# Patient Record
Sex: Male | Born: 1965 | Race: White | Hispanic: No | Smoking: Former smoker
Health system: Southern US, Community
[De-identification: ages and names within clinical notes are randomized; demographics above are authoritative.]

## PROBLEM LIST (undated history)

## (undated) DIAGNOSIS — T7840XA Allergy, unspecified, initial encounter: Secondary | ICD-10-CM

## (undated) DIAGNOSIS — R3915 Urgency of urination: Secondary | ICD-10-CM

## (undated) DIAGNOSIS — G479 Sleep disorder, unspecified: Secondary | ICD-10-CM

## (undated) DIAGNOSIS — R319 Hematuria, unspecified: Secondary | ICD-10-CM

## (undated) DIAGNOSIS — N400 Enlarged prostate without lower urinary tract symptoms: Secondary | ICD-10-CM

## (undated) HISTORY — PX: DENTAL SURGERY: SHX609

## (undated) HISTORY — PX: FRACTURE SURGERY: SHX138

## (undated) HISTORY — PX: VASECTOMY: SHX75

## (undated) HISTORY — DX: Hematuria, unspecified: R31.9

## (undated) HISTORY — PX: HERNIA REPAIR: SHX51

## (undated) HISTORY — PX: APPENDECTOMY: SHX54

## (undated) HISTORY — DX: Urgency of urination: R39.15

## (undated) HISTORY — DX: Allergy, unspecified, initial encounter: T78.40XA

## (undated) HISTORY — DX: Benign prostatic hyperplasia without lower urinary tract symptoms: N40.0

## (undated) HISTORY — DX: Sleep disorder, unspecified: G47.9

---

## 2006-06-20 ENCOUNTER — Emergency Department (HOSPITAL_COMMUNITY): Admission: EM | Admit: 2006-06-20 | Discharge: 2006-06-20 | Payer: Self-pay | Admitting: Emergency Medicine

## 2006-07-25 ENCOUNTER — Ambulatory Visit (HOSPITAL_COMMUNITY): Admission: RE | Admit: 2006-07-25 | Discharge: 2006-07-25 | Payer: Self-pay | Admitting: General Surgery

## 2007-01-06 ENCOUNTER — Emergency Department (HOSPITAL_COMMUNITY): Admission: EM | Admit: 2007-01-06 | Discharge: 2007-01-06 | Payer: Self-pay | Admitting: Emergency Medicine

## 2007-09-26 ENCOUNTER — Emergency Department (HOSPITAL_COMMUNITY): Admission: EM | Admit: 2007-09-26 | Discharge: 2007-09-26 | Payer: Self-pay | Admitting: Emergency Medicine

## 2008-07-03 HISTORY — PX: SHOULDER SURGERY: SHX246

## 2008-11-06 ENCOUNTER — Observation Stay (HOSPITAL_COMMUNITY): Admission: AC | Admit: 2008-11-06 | Discharge: 2008-11-07 | Payer: Self-pay

## 2010-10-11 LAB — POCT I-STAT, CHEM 8
Glucose, Bld: 133 mg/dL — ABNORMAL HIGH (ref 70–99)
HCT: 48 % (ref 39.0–52.0)
Hemoglobin: 16.3 g/dL (ref 13.0–17.0)
Potassium: 3.9 meq/L (ref 3.5–5.1)
Sodium: 140 meq/L (ref 135–145)
TCO2: 22 mmol/L (ref 0–100)

## 2010-10-11 LAB — CBC
Platelets: 270 10*3/uL (ref 150–400)
RDW: 13.1 % (ref 11.5–15.5)
WBC: 10 10*3/uL (ref 4.0–10.5)

## 2010-10-11 LAB — ABO/RH: ABO/RH(D): A POS

## 2010-10-11 LAB — TYPE AND SCREEN
ABO/RH(D): A POS
Antibody Screen: NEGATIVE

## 2010-10-11 LAB — PROTIME-INR: Prothrombin Time: 13.8 seconds (ref 11.6–15.2)

## 2010-10-11 LAB — POCT CARDIAC MARKERS: CKMB, poc: <1 ng/mL — ABNORMAL LOW (ref 1.0–8.0)
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
## 2010-11-18 NOTE — Op Note (Signed)
NAMEJORRELL, KUSTER               ACCOUNT NO.:  [id number]   MEDICAL RECORD NO.:  [id number]          PATIENT TYPE:  AMB   LOCATION:  DAY                          FACILITY:  [facility]   PHYSICIAN:  Anselm Pancoast. Weatherly, M.D.DATE OF BIRTH:  [birth year]   DATE OF PROCEDURE:  07/25/2006  DATE OF DISCHARGE:                               OPERATIVE REPORT   PREOPERATIVE DIAGNOSES:  Right inguinal hernia.   POSTOPERATIVE DIAGNOSES:  Not given.   OPERATION:  Right inguinal herniorrhaphy with mesh reinforcement.   ANESTHESIA:  General.   HISTORY:  Josejuan Hoaglin is 45 year old male who has a symptomatic right  inguinal hernia. He said he first noticed this probably a year ago. He  would admittedly get some testicular swelling and pain in the right  groin when he was doing strenuous activity. He is a singer in a band and  with his sustained breathing in the high note, he noticed that this  would give him discomfort. He had been to the [facility] emergency room 2  weeks before Christmas and the PA diagnosed a right inguinal hernia and  referred him to [place] and I saw him the first week of January.  On examination, he is tall, weighs about 175 pounds. He is a lanky  individual. I did not appreciate any organomegaly and with straining he  has got an easily noticed right inguinal hernia. It looks like more of  an indirect hernia to me. I could appreciate no abnormality on the  testicular exam, I do not appreciate a definite hydrocele and I do not  appreciate any abnormality on the left. He is scheduled for the  herniorrhaphy and he elected for general anesthesia.   The patient was taken to the operative suite and after the patient  identified, we marked the right side. He was induced with anesthesia,  endotracheal tube. The right groin area was clipped and then prepped  with Betadine solution. The inguinal incision area was infiltrated with  0.5% Marcaine with adrenaline, the ilioinguinal nerve area was  anesthetized with about 10 mL of this solution with a blunted 22 gauge  needle. Sharp dissection into the skin and subcutaneous tissue, there  were 2 superficial veins that were clamped and ligated with 3-0 Vicryl.  Then the external oblique aponeurosis opened up through the external  ring. The cord structures were elevated. There was an indirect hernia  sac about 3 inches in length that was separated from the cord structures  and a high sack ligation done with 2-0 Surgilon under direct vision. He  does have kind of a weakness in the floor, not really a definite large  direct but I elected to repair this with kind of a modified __________  starting at the symphysis pubis, suturing the conjoined tendon to the  shelving edge of the inguinal ligament going back typing the 2 ends  together and the new internal ring area with it at the tip of my  fingertip. I then used a piece of mesh and made a __________  slit  laterally to reinforce the floor. It was sutured to the  inguinal  ligament with a running 2-0 Prolene. The two tails sutured together  laterally and then the superior flap sutured to the rectus over the  internal oblique with interrupted sutures of 2-0 Vicryl. The mesh is  lying flat, not excessively taut. I did use about 5 mL of the Marcaine  in the inguinal ligament area and then the external oblique was closed  with a running 3-0 Vicryl. The ilioinguinal nerve is with the cord  structures and the Scarpa's fascia was closed with interrupted 3-0  Vicryl and then 4-0 Dexon used subcuticular. Benzoin and Steri-Strips on  the skin. The testicle was in its normal position and he was extubated  and sent to the  recovery room in a stable postop condition. The patient will be released  after a short stay and instructed not to drive if he has taken any  Vicodin. Hopefully he will be back to light activity next week and about  3 weeks before doing strenuous activity.            ______________________________  Anselm Pancoast. Zachery Dakins, M.D.     WJW/MEDQ  D:  07/25/2006  T:  07/25/2006  Job:  [id number]

## 2011-03-27 LAB — DIFFERENTIAL
Lymphocytes Relative: 16
Lymphs Abs: 1.4
Monocytes Relative: 11
Neutro Abs: 6.4
Neutrophils Relative %: 73

## 2011-03-27 LAB — COMPREHENSIVE METABOLIC PANEL
ALT: 18
BUN: 22
CO2: 25
Calcium: 9.7
Creatinine, Ser: 1.29
GFR calc non Af Amer: >60
Glucose, Bld: 118 — ABNORMAL HIGH
Sodium: 138
Total Protein: 7.3

## 2011-03-27 LAB — CBC
Hemoglobin: 16.1
MCHC: 34.4
MCV: 87.7
RDW: 13.7

## 2011-03-27 LAB — URINALYSIS, ROUTINE W REFLEX MICROSCOPIC
Glucose, UA: 100 — AB
Hgb urine dipstick: NEGATIVE
Protein, ur: 100 — AB
Specific Gravity, Urine: 1.029
pH: 6

## 2011-03-27 LAB — URINE MICROSCOPIC-ADD ON

## 2013-12-14 ENCOUNTER — Ambulatory Visit (INDEPENDENT_AMBULATORY_CARE_PROVIDER_SITE_OTHER): Payer: No Typology Code available for payment source | Admitting: Internal Medicine

## 2013-12-14 VITALS — BP 100/70 | HR 80 | Temp 97.7°F | Resp 14 | Ht 69.5 in | Wt 153.4 lb

## 2013-12-14 DIAGNOSIS — F411 Generalized anxiety disorder: Secondary | ICD-10-CM

## 2013-12-14 DIAGNOSIS — G4700 Insomnia, unspecified: Secondary | ICD-10-CM | POA: Insufficient documentation

## 2013-12-14 DIAGNOSIS — B356 Tinea cruris: Secondary | ICD-10-CM | POA: Insufficient documentation

## 2013-12-14 DIAGNOSIS — Z23 Encounter for immunization: Secondary | ICD-10-CM

## 2013-12-14 DIAGNOSIS — F17200 Nicotine dependence, unspecified, uncomplicated: Secondary | ICD-10-CM | POA: Insufficient documentation

## 2013-12-14 MED ORDER — KETOCONAZOLE 2 % EX CREA: 1.0000 "application " | TOPICAL_CREAM | Freq: Two times a day (BID) | CUTANEOUS | Status: DC | PRN

## 2013-12-14 MED ORDER — FLUOXETINE HCL 20 MG PO TABS: 20.0000 mg | ORAL_TABLET | Freq: Every day | ORAL | Status: DC

## 2013-12-14 MED ORDER — CLONAZEPAM 0.5 MG PO TABS: 0.5000 mg | ORAL_TABLET | Freq: Every day | ORAL | Status: DC

## 2013-12-14 NOTE — Progress Notes (Signed)
Subjective:    Patient ID: Dylan Jones, male   Rosanne Ashing DOB: 1966/05/26, 48 y.o.   MRN: [id number]  HPI  Pt was referred by Dr. Zachery DauerBarnes. Pt is having a hard time recently, his mother passed away in December and a month later he and his wife seperated --she has since walked out [employer]. C. Huprich sent him to consider meds.  He states he is having increased trouble sleeping. He does note issues sleeping for years, he does not know the cause of this issue, but does note it has worsened recently. He denies issues related to sleep apnea.  He is noticing issues at work due to lack of sleep and having issues focusing on one task. He believes these issues precipitated after his mom passed away. Pt does have a good relationship with his father. Lack of motivation. Anhedonism.  Pt does note significant weight loss since the death of his mother. He states he went from the 190's to 150's over the past 6 months. He denies issues with alcohol abuse, he states he does drink socially. He doesn't think he has necessarily had less appetite or intake.  He also notes chest pain a couple of months ago, with trouble lifting with his left arm for a couple of weeks following. He states this chest pain was across his chest, not centralized on one side. He describes this chest pain as burning/uncomfortable. This chest pain resolved on its own. He now has full lifting. This was not associated with palpitations shortness of breath or diaphoresis.  Pt states he did start smoking again after quitting some years ago. He believes he did this to help cope with all of the stress he is enduring.    He also states he has a rash which he has been unable to treat with OTC medication. He believes this persistent rash is due to working in a hot environment and sweating quite a bit. He was seen previously for this issue and was prescribed ketoconazole for what was diagnosed as a yeast infection. He does note relief with  this medication.  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 Negative for fever, activity change, appetite change and fatigue.  HENT: Negative for dental problem and trouble swallowing.   Respiratory: Negative for cough, choking, chest tightness, shortness of breath and wheezing.   Cardiovascular: Negative for palpitations and leg swelling.  Gastrointestinal: Negative for nausea, vomiting, abdominal pain, diarrhea and constipation.  Endocrine: Negative for cold intolerance, heat intolerance, polydipsia, polyphagia and polyuria.  Genitourinary: Negative for dysuria, urgency, frequency, difficulty urinating and testicular pain.  Musculoskeletal: Negative for arthralgias, back pain, gait problem and neck pain.  Neurological: Negative for dizziness, speech difficulty, numbness and headaches.  Hematological: Negative for adenopathy. Does not bruise/bleed easily.  Psychiatric/Behavioral: Negative for suicidal ideas, hallucinations, confusion and self-injury.       Objective:   Physical Exam BP 100/70  Pulse 80  Temp(Src) 97.7 F (36.5 C) (Oral)  Resp 14  Ht 5' 9.5" (1.765 m)  Wt 153 lb 6.4 oz (69.582 kg)  BMI 22.34 kg/m2  SpO2 98% No acute distress PERRLA/EOMs conjugate No nodes or thyromegaly/thyroid nodules Heart regular without murmur click Lungs clear Extremities good pulses and no edema Neurological intact Mood is stable/affect appropriate/thought content normal   Tdap Administered    Assessment & Plan:  Insomnia, unspecified  Generalized anxiety disorder  Tinea cruris  Nicotine addiction  Abnormal weight loss  Continued counseling/start Prozac/use Klonopin at bedtime to reestablish sleep cycle and decreased sleep  Deprivation Followup 3 weeks appt 104 He will need other health maintenance items attended to as he is approaching 48  Meds  ordered this encounter  Medications  . FLUoxetine (PROZAC) 20 MG tablet    Sig: Take 1 tablet (20 mg total) by mouth daily.    Dispense:  30 tablet    Refill:  0  . clonazePAM (KLONOPIN) 0.5 MG tablet    Sig: Take 1 tablet (0.5 mg total) by mouth at bedtime.    Dispense:  30 tablet    Refill:  0  . ketoconazole (NIZORAL) 2 % cream    Sig: Apply 1 application topically 2 (two) times daily as needed for irritation.    Dispense:  30 g    Refill:  3

## 2013-12-15 ENCOUNTER — Telehealth: Payer: Self-pay

## 2013-12-15 NOTE — Telephone Encounter (Signed)
PT WAS SEEN BY THE DR AND GIVEN PROZAC TO TRY, STATED HE ISN'T WILLING TAKE THAT AND WOULD LIKE TO SPEAK WITH SOMEONE ELSE ABOUT HAVING SOMETHING ELSE CALLED IN PLEASE CALL PT AT [phone]

## 2013-12-15 NOTE — Telephone Encounter (Signed)
Pt states that he has known many people that have been on Prozac and does not want to have similar side effects. He states he is concerned about the high and low feeling side effects. He states he does not want to try an antidepressive medications. He does not want to try anything else. He is going to try just the Klonopin at night. He states that most of his anxiety and insomnia is caused by his current situation. He hopes that this will be resolved soon. He has tried Xanax and does not feel fatigued, he is wondering if he would be able to get a prescription for 0.5mg  Xanax to have for the day time anxiety and stress.

## 2013-12-18 NOTE — Telephone Encounter (Signed)
Klonopin is same as xanax so try that 1/2-1 during day as well as hs--then we can adjust as needed

## 2013-12-18 NOTE — Telephone Encounter (Signed)
Pt called, I read to him the message from Dr. Merla Richesoolittle, he understands, and agrees. He just asked if I could let the Dr. Ashley MarinerKnow that taking two at night has helped him sleep more effectively than taking one pill, he will try the 1/2-1 during the day, but just wants to know if it is ok for him to continue using 2 at night to sleep, and if maybe his script would need to be written differently?

## 2013-12-18 NOTE — Telephone Encounter (Signed)
Pt states that taking two Klonopin at night is the best fit for him. He only needs this to get to sleep. I have pended a refill with qty change and direction change. Please advise.

## 2013-12-23 MED ORDER — CLONAZEPAM 1 MG PO TABS
1.0000 mg | ORAL_TABLET | Freq: Every day | ORAL | Status: DC
Start: 2013-12-23 — End: 2014-01-07

## 2013-12-23 NOTE — Telephone Encounter (Signed)
I have written the Rx for the higher dose so he only has to take  Pill at night and I have given him 30 pills to last a month.

## 2013-12-24 NOTE — Telephone Encounter (Signed)
Lm advise pt rx faxed over to pharmacy. Advised pt dose change.

## 2013-12-24 NOTE — Progress Notes (Signed)
Appointment scheduled 7/8 at 130.

## 2014-01-07 ENCOUNTER — Encounter: Payer: Self-pay | Admitting: Internal Medicine

## 2014-01-07 ENCOUNTER — Ambulatory Visit: Payer: Self-pay | Admitting: Internal Medicine

## 2014-01-07 ENCOUNTER — Ambulatory Visit (INDEPENDENT_AMBULATORY_CARE_PROVIDER_SITE_OTHER): Payer: No Typology Code available for payment source | Admitting: Internal Medicine

## 2014-01-07 VITALS — BP 105/63 | HR 62 | Temp 98.4°F | Resp 16 | Ht 71.75 in | Wt 153.6 lb

## 2014-01-07 DIAGNOSIS — G4700 Insomnia, unspecified: Secondary | ICD-10-CM

## 2014-01-07 MED ORDER — CLONAZEPAM 1 MG PO TABS: 1.0000 mg | ORAL_TABLET | Freq: Every day | ORAL | Status: DC
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
## 2014-12-08 ENCOUNTER — Ambulatory Visit (INDEPENDENT_AMBULATORY_CARE_PROVIDER_SITE_OTHER): Payer: 59 | Admitting: Internal Medicine

## 2014-12-08 VITALS — BP 120/72 | HR 65 | Temp 98.4°F | Resp 18 | Ht 71.75 in | Wt 165.0 lb

## 2014-12-08 DIAGNOSIS — G4700 Insomnia, unspecified: Secondary | ICD-10-CM | POA: Diagnosis not present

## 2014-12-08 DIAGNOSIS — J0100 Acute maxillary sinusitis, unspecified: Secondary | ICD-10-CM | POA: Diagnosis not present

## 2014-12-08 MED ORDER — AMOXICILLIN 500 MG PO CAPS: 1000.0000 mg | ORAL_CAPSULE | Freq: Two times a day (BID) | ORAL | Status: AC

## 2014-12-08 MED ORDER — CLONAZEPAM 1 MG PO TABS: 1.0000 mg | ORAL_TABLET | Freq: Every day | ORAL | Status: DC

## 2014-12-08 NOTE — Progress Notes (Signed)
   Subjective:    Patient ID: Dylan Jones, male    DOB: 11-Oct-1965, 49 y.o.   MRN: [id number] This chart was scribed for Dylan Siaobert Doolittle, MD by [name], Medical Scribe. This patient was seen in Room 4 and the patient's care was started a 6:12 PM.  Chief Complaint  Patient presents with  . Sinusitis    x1 week   . sinus pressure  . Facial Pain  . Medication Refill    klonopin    HPI HPI Comments: Dylan AshingDarrell R Dominic is a 49 y.o. male who presents to [facility] complaining of sinus pressure and sinus pain for the last 10 days. Pt states he spent a few days outside one week ago and since that time he has had these sx. Pt states he has been taking Advil cold and sinus for the last 10 days without relief. Pt endorses associated rhinorrhea. Pt takes nasonex daily.   Pt also states he needs his kolonopin refilled.   Has been healthy otherwise/still smoking unwilling to quit  Review of Systems  Constitutional: Negative for fever and chills.  HENT: Positive for congestion, rhinorrhea and sinus pressure.   Respiratory: Negative for shortness of breath and wheezing.   Neurological: Positive for headaches.       Objective:   Physical Exam  Constitutional: He appears well-developed and well-nourished. No distress.  HENT:  Head: Normocephalic and atraumatic.  Right Ear: External ear normal.  Left Ear: External ear normal.  Mouth/Throat: Oropharynx is clear and moist.  Purulent nasal discharge bilaterally. Sinus tenderness to percussion bilaterally.  Eyes: Conjunctivae and EOM are normal. Pupils are equal, round, and reactive to light.  Neck: Neck supple. No thyromegaly present.  Cardiovascular: Normal rate and regular rhythm.   Pulmonary/Chest: Effort normal and breath sounds normal. No respiratory distress.  Lymphadenopathy:    He has no cervical adenopathy.  Skin: No rash noted. He is not diaphoretic.  Nursing note and vitals reviewed.      Assessment & Plan:  Acute maxillary  sinusitis, recurrence not specified  Insomnia  Meds ordered this encounter  Medications  . clonazePAM (KLONOPIN) 1 MG tablet    Sig: Take 1 tablet (1 mg total) by mouth at bedtime.    Dispense:  30 tablet    Refill:  5  . amoxicillin (AMOXIL) 500 MG capsule    Sig: Take 2 capsules (1,000 mg total) by mouth 2 (two) times daily.    Dispense:  40 capsule    Refill:  0   Follow-up for 49 year old CPE this year  I have completed the patient encounter in its entirety as documented by the scribe, with editing by me where necessary. Robert P. Merla Richesoolittle, M.D.

## 2015-02-15 ENCOUNTER — Other Ambulatory Visit: Payer: Self-pay | Admitting: Internal Medicine

## 2015-04-21 ENCOUNTER — Encounter: Payer: Self-pay | Admitting: Internal Medicine

## 2015-04-21 ENCOUNTER — Ambulatory Visit (INDEPENDENT_AMBULATORY_CARE_PROVIDER_SITE_OTHER): Payer: 59 | Admitting: Internal Medicine

## 2015-04-21 VITALS — BP 120/71 | HR 86 | Temp 99.4°F | Resp 16 | Ht 72.5 in | Wt 165.2 lb

## 2015-04-21 DIAGNOSIS — J0100 Acute maxillary sinusitis, unspecified: Secondary | ICD-10-CM | POA: Diagnosis not present

## 2015-04-21 MED ORDER — CETIRIZINE-PSEUDOEPHEDRINE ER 5-120 MG PO TB12: 1.0000 | ORAL_TABLET | Freq: Two times a day (BID) | ORAL | Status: DC

## 2015-04-21 MED ORDER — KETOCONAZOLE 2 % EX CREA: 1.0000 "application " | TOPICAL_CREAM | Freq: Two times a day (BID) | CUTANEOUS | Status: DC | PRN

## 2015-04-21 MED ORDER — AMOXICILLIN-POT CLAVULANATE 875-125 MG PO TABS: 1.0000 | ORAL_TABLET | Freq: Two times a day (BID) | ORAL | Status: DC

## 2015-04-21 NOTE — Progress Notes (Signed)
   Subjective:    Patient ID: Dylan Jones, male    DOB: 1965/12/26, 49 y.o.   MRN: [id number]  HPI Complaining of purulent nasal discharge with sinus pressure and headaches for the past 3 weeks. Over the past 2 days has had fever and worsening of symptoms. Today has a sore throat. No coughing or shortness of breath. He has a history of allergies and recurrent sinus infections. He was last treated in June 2016 for sinusitis and felt like he got well although he doesn't usually respond to amoxicillin. It is important to note that he quit smoking in February 2016.   Review of Systems Noncontributory to present illness It is notable that he has nocturia 2 or 3, a past history of BPH evaluated by urology 5 years ago and felt to be normal for age and he was given Flomax He would like to use this again He has not had any health maintenance recently    Objective:   Physical Exam BP 120/71 mmHg  Pulse 86  Temp(Src) 99.4 F (37.4 C) (Oral)  Resp 16  Ht 6' 0.5" (1.842 m)  Wt 165 lb 3.2 oz (74.934 kg)  BMI 22.09 kg/m2 PERRLA TMs clear Nares with purulent discharge Oral pharynx red posteriorly without exudate No nodes or thyromegaly Chest clear to auscultation       Assessment & Plan:  Acute maxillary sinusitis--recurrent disease Underlying allergic rhinitis Former smoker  Meds ordered this encounter  Medications  . Cetirizine-Pseudoephedrine (ZYRTEC-D PO)    Sig: Take by mouth.  Marland Kitchen. amoxicillin-clavulanate (AUGMENTIN) 875-125 MG tablet    Sig: Take 1 tablet by mouth 2 (two) times daily.    Dispense:  20 tablet    Refill:  0  . cetirizine-pseudoephedrine (ZYRTEC-D) 5-120 MG tablet    Sig: Take 1 tablet by mouth 2 (two) times daily.    Dispense:  60 tablet    Refill:  5  . ketoconazole (NIZORAL) 2 % cream    Sig: Apply 1 application topically 2 (two) times daily as needed for irritation.    Dispense:  30 g    Refill:  3   refill for chronic recurrent tinea cruris  As was  recommended at the last visit he needs CPE for health maintenance and addressing all his current complaints particularly genitourinary He has a history of prostate cancer in his grandfather but not his father

## 2015-04-22 ENCOUNTER — Telehealth: Payer: Self-pay

## 2015-04-22 NOTE — Telephone Encounter (Signed)
Patient called back to check status. He thought when he picked up he thought it was wrong medication sent in. States he told Dr. Merla Richesoolittle he did not want amoxicillin so could he prescribe something else instead, Dr. Merla Richesoolittle told him he would send in Augmentin. When he p/u it said amoxicillian-clavulanate he thought it was regular amoxicillin. I explained that was generic name for Augmentin and he was not aware so he will pick this up and start it.

## 2015-04-22 NOTE — Telephone Encounter (Signed)
Pt needs to change his antibodic he was given amoxicillan yesterday and him and dr Merla Richesdoolittle discussed that this did not work well for him but was still given amxoicillian and he would like it changed

## 2015-04-22 NOTE — Telephone Encounter (Signed)
Pt called to see if any action has been taken on his Rx change//Reported no action take yet.  Please call PT @ [phone]  [phone]

## 2015-04-23 ENCOUNTER — Telehealth: Payer: Self-pay | Admitting: Internal Medicine

## 2015-04-23 NOTE — Telephone Encounter (Signed)
Pt missed his CB. Please advise at [phone]

## 2015-04-23 NOTE — Telephone Encounter (Signed)
Patient states that he has some additional concerns and questions about his condition and medication.  [phone]

## 2015-04-23 NOTE — Telephone Encounter (Signed)
Left message for pt to call back  °

## 2015-04-24 NOTE — Telephone Encounter (Signed)
Patient was here earlier in the week annd has been on ABX since Wed, now he has decreased sinus pain but his throat is sore. I have advised him if this does not improve within the next day to return to clinic, he is instructed to return if his throat does not improve by tomorrow. To you FYI

## 2015-04-26 NOTE — Progress Notes (Signed)
Patient has an appointment to Dylan Jones for a CPE on 05/13/15 @ 3pm.

## 2015-04-27 ENCOUNTER — Telehealth: Payer: Self-pay

## 2015-04-27 NOTE — Telephone Encounter (Signed)
Pt states he is having loose stools as a side effect and would like to know what he can take over the counter to help (the Medication is augmentin)

## 2015-04-28 NOTE — Telephone Encounter (Signed)
Advised pt

## 2015-04-28 NOTE — Telephone Encounter (Signed)
If the stools are loose, but he can make it to the bathroom in time, it's best just to let it be.  If he is having difficulty getting tot he bathroom on time, he can use OTC loperamide (Immodium) twice each day.

## 2015-05-13 ENCOUNTER — Encounter: Payer: 59 | Admitting: Physician Assistant

## 2015-05-26 ENCOUNTER — Encounter: Payer: 59 | Admitting: Physician Assistant

## 2015-06-24 ENCOUNTER — Other Ambulatory Visit: Payer: Self-pay | Admitting: Internal Medicine

## 2016-02-16 ENCOUNTER — Ambulatory Visit: Payer: Self-pay

## 2018-05-09 ENCOUNTER — Telehealth: Payer: Self-pay

## 2018-05-09 NOTE — Telephone Encounter (Signed)
SENT REFERRAL TO SCHEDULING AND FILED NOTES 

## 2018-05-14 ENCOUNTER — Encounter: Payer: Self-pay | Admitting: Cardiovascular Disease

## 2018-05-14 NOTE — Progress Notes (Signed)
Chief Complaint  Patient presents with  . New Patient (Initial Visit)   History of Present Illness: 52 yo male with history of electrocution July 2019, enlarged prostate who is here today as a new consultk, referred by Leeroy BockBreejanta Williams, PA-C for evaluation of dyspnea and dizziness with exertion. No prior cardiac issues. He was electrocuted by a Conservator, museum/galleryscreen printing machine in July 2019. He describes dyspnea with moderate exertion. No exertional chest pain. He does feel lightheaded at time. No near syncope or syncope. He feels fatigued all of the time. He has occasional palpitations. No lower ext edema. He smoked for 35 years but quit in 2015. CBC and TSH normal 05/07/18. (labs in Care Everywhere). Orthostatics negative in primary care last week.   Primary Care Physician: Bernita BuffyWilliams, Breejante J, PA-C   Past Medical History:  Diagnosis Date  . Allergy   . Enlarged prostate   . Hematuria   . Sleep disturbance   . Urinary urgency     Past Surgical History:  Procedure Laterality Date  . APPENDECTOMY    . DENTAL SURGERY    . FRACTURE SURGERY    . HERNIA REPAIR    . SHOULDER SURGERY Left 2010  . VASECTOMY      Current Outpatient Medications  Medication Sig Dispense Refill  . diphenhydrAMINE (BENADRYL) 25 MG tablet Take 25 mg by mouth as needed for allergies.    . finasteride (PROSCAR) 5 MG tablet Take 5 mg by mouth daily.    . Ibuprofen (ADVIL) 200 MG CAPS Take 1 capsule by mouth as needed.     No current facility-administered medications for this visit.     No Known Allergies  Social History   Socioeconomic History  . Marital status: Divorced    Spouse name: Not on file  . Number of children: 2  . Years of education: Not on file  . Highest education level: Not on file  Occupational History  . Occupation: Insurance underwriterceenprinter Graphic designer  Social Needs  . Financial resource strain: Not on file  . Food insecurity:    Worry: Not on file    Inability: Not on file  .  Transportation needs:    Medical: Not on file    Non-medical: Not on file  Tobacco Use  . Smoking status: Former Smoker    Packs/day: 1.00    Years: 35.00    Pack years: 35.00    Types: Cigarettes    Last attempt to quit: 08/15/2013    Years since quitting: 4.7  . Smokeless tobacco: Never Used  Substance and Sexual Activity  . Alcohol use: Yes    Alcohol/week: 0.0 standard drinks    Comment: socially  . Drug use: No  . Sexual activity: Not on file  Lifestyle  . Physical activity:    Days per week: Not on file    Minutes per session: Not on file  . Stress: Not on file  Relationships  . Social connections:    Talks on phone: Not on file    Gets together: Not on file    Attends religious service: Not on file    Active member of club or organization: Not on file    Attends meetings of clubs or organizations: Not on file    Relationship status: Not on file  . Intimate partner violence:    Fear of current or ex partner: Not on file    Emotionally abused: Not on file    Physically abused: Not on file  Forced sexual activity: Not on file  Other Topics Concern  . Not on file  Social History Narrative  . Not on file    Family History  Problem Relation Age of Onset  . Breast cancer Mother   . Heart disease Mother   . Hearing loss Mother   . Migraines Mother   . Alzheimer's disease Mother   . Stroke Mother   . Heart attack Father     Review of Systems:  As stated in the HPI and otherwise negative.   BP 114/70   Pulse (!) 57   Ht 6' 0.05" (1.83 m)   Wt 183 lb 12.8 oz (83.4 kg)   SpO2 99%   BMI 24.89 kg/m   Physical Examination: General: Well developed, well nourished, NAD  HEENT: OP clear, mucus membranes moist  SKIN: warm, dry. No rashes. Neuro: No focal deficits  Musculoskeletal: Muscle strength 5/5 all ext  Psychiatric: Mood and affect normal  Neck: No JVD, no carotid bruits, no thyromegaly, no lymphadenopathy.  Lungs:Clear bilaterally, no wheezes,  rhonci, crackles Cardiovascular: Regular rate and rhythm. No murmurs, gallops or rubs. Abdomen:Soft. Bowel sounds present. Non-tender.  Extremities: No lower extremity edema. Pulses are 2 + in the bilateral DP/PT.  EKG:  EKG is ordered today. The ekg ordered today demonstrates Sinus brady, rate 57 bpm. Subtle ST abn anterolateral leads, likely repolarization abnormalty.   Recent Labs: No results found for requested labs within last 8760 hours.   Lipid Panel No results found for: CHOL, TRIG, HDL, CHOLHDL, VLDL, LDLCALC, LDLDIRECT   Wt Readings from Last 3 Encounters:  05/15/18 183 lb 12.8 oz (83.4 kg)  04/21/15 165 lb 3.2 oz (74.9 kg)  12/08/14 165 lb (74.8 kg)     Other studies Reviewed: Additional studies/ records that were reviewed today include: . Review of the above records demonstrates:    Assessment and Plan:   1. Dyspnea: Will arrange echocardiogram to assess LV function and exclude structural heart disease. Will arrange exercise nuclear stress test to exclude ischemia.   Current medicines are reviewed at length with the patient today.  The patient does not have concerns regarding medicines.  The following changes have been made:  no change  Labs/ tests ordered today include:   Orders Placed This Encounter  Procedures  . MYOCARDIAL PERFUSION IMAGING  . EKG 12-Lead  . ECHOCARDIOGRAM COMPLETE     Disposition:   FU with me in 6-8 weeks   Signed, Verne Carrow, MD 05/15/2018 9:32 AM    [facility] [address] [place], [place], [place]  [address] Phone: [phone]; Fax: [phone]

## 2018-05-15 ENCOUNTER — Encounter (INDEPENDENT_AMBULATORY_CARE_PROVIDER_SITE_OTHER): Payer: Self-pay

## 2018-05-15 ENCOUNTER — Encounter: Payer: Self-pay | Admitting: *Deleted

## 2018-05-15 ENCOUNTER — Encounter: Payer: Self-pay | Admitting: Cardiovascular Disease

## 2018-05-15 ENCOUNTER — Ambulatory Visit: Payer: BLUE CROSS/BLUE SHIELD | Admitting: Cardiovascular Disease

## 2018-05-15 VITALS — BP 114/70 | HR 57 | Ht 72.05 in | Wt 183.8 lb

## 2018-05-15 DIAGNOSIS — R0602 Shortness of breath: Secondary | ICD-10-CM | POA: Diagnosis not present

## 2018-05-15 NOTE — Patient Instructions (Addendum)
Medication Instructions:  Your physician recommends that you continue on your current medications as directed. Please refer to the Current Medication list given to you today.  If you need a refill on your cardiac medications before your next appointment, please call your pharmacy.   Lab work: none If you have labs (blood work) drawn today and your tests are completely normal, you will receive your results only by: Marland Kitchen. MyChart Message (if you have MyChart) OR . A paper copy in the mail If you have any lab test that is abnormal or we need to change your treatment, we will call you to review the results.  Testing/Procedures: Your physician has requested that you have an echocardiogram. Echocardiography is a painless test that uses sound waves to create images of your heart. It provides your doctor with information about the size and shape of your heart and how well your heart's chambers and valves are working. This procedure takes approximately one hour. There are no restrictions for this procedure.  Your physician has requested that you have en exercise stress myoview. For further information please visit [email or web address]. Please follow instruction sheet, as given.   Follow-Up: Your physician recommends that you schedule a follow-up appointment in: 6-8 weeks with Dr. Arvilla MarketMcAlhany--scheduled for January 6,2020 at 10:40

## 2018-05-21 ENCOUNTER — Telehealth (HOSPITAL_COMMUNITY): Payer: Self-pay | Admitting: *Deleted

## 2018-05-21 NOTE — Telephone Encounter (Signed)
Patient given detailed instructions per Myocardial Perfusion Study Information Sheet for the test on 05/27/18 at 0715. Patient notified to arrive 15 minutes early and that it is imperative to arrive on time for appointment to keep from having the test rescheduled.  If you need to cancel or reschedule your appointment, please call the office within 24 hours of your appointment. . Patient verbalized understanding.Dylan Jones, [name]

## 2018-05-27 ENCOUNTER — Ambulatory Visit (HOSPITAL_COMMUNITY): Payer: BLUE CROSS/BLUE SHIELD | Attending: Cardiology

## 2018-05-27 ENCOUNTER — Ambulatory Visit (HOSPITAL_BASED_OUTPATIENT_CLINIC_OR_DEPARTMENT_OTHER): Payer: BLUE CROSS/BLUE SHIELD

## 2018-05-27 ENCOUNTER — Other Ambulatory Visit: Payer: Self-pay

## 2018-05-27 DIAGNOSIS — R0602 Shortness of breath: Secondary | ICD-10-CM

## 2018-05-27 LAB — MYOCARDIAL PERFUSION IMAGING
CHL CUP MPHR: 166 {beats}/min
CHL CUP NUCLEAR SSS: 1
CHL CUP RESTING HR STRESS: 56 {beats}/min
CHL RATE OF PERCEIVED EXERTION: 19
CSEPED: 9 min
CSEPEW: 10.1 METS
CSEPHR: 86 %
Exercise duration (sec): 30 s
LV dias vol: 97 mL (ref 62–150)
LV sys vol: 42 mL
Peak HR: 144 {beats}/min
SDS: 1
SRS: 0
TID: 0.85

## 2018-05-27 MED ORDER — TECHNETIUM TC 99M TETROFOSMIN IV KIT
31.7000 | PACK | Freq: Once | INTRAVENOUS | Status: AC | PRN
  Administered 2018-05-27: 31.7 via INTRAVENOUS
  Filled 2018-05-27: qty 32

## 2018-05-27 MED ORDER — TECHNETIUM TC 99M TETROFOSMIN IV KIT
10.2000 | PACK | Freq: Once | INTRAVENOUS | Status: AC | PRN
  Administered 2018-05-27: 10.2 via INTRAVENOUS
  Filled 2018-05-27: qty 11

## 2018-07-08 ENCOUNTER — Ambulatory Visit: Payer: BLUE CROSS/BLUE SHIELD | Admitting: Cardiovascular Disease

## 2018-07-11 ENCOUNTER — Institutional Professional Consult (permissible substitution): Payer: BLUE CROSS/BLUE SHIELD | Admitting: Pulmonary Disease

## 2018-07-16 ENCOUNTER — Ambulatory Visit: Payer: BLUE CROSS/BLUE SHIELD | Admitting: Pulmonary Disease

## 2018-07-16 ENCOUNTER — Encounter: Payer: Self-pay | Admitting: Pulmonary Disease

## 2018-07-16 DIAGNOSIS — R0602 Shortness of breath: Secondary | ICD-10-CM

## 2018-07-16 NOTE — Progress Notes (Signed)
Dylan Jones    [id number]    [birth year]  Primary Care Physician:Williams, Karis Juba, PA-C  Referring Physician: Roger Kill, PA-C [id number] [place] [address]  Chief complaint:   Patient with shortness of breath since about November  HPI:  Has an occasional cough especially when he had a URI No other associated symptoms He feels the shortness of breath is getting better Evaluation by cardiology was unrevealing Has no underlying lung disease known to him He did smoke in the past Smoked about a pack to pack and a half a day  No weight loss Good appetite  Has not had any fevers or chills  He works with a Art gallery manager.  Exposed to a lot of dust/fumes  No family history of lung disease  Outpatient Encounter Medications as of 07/16/2018  Medication Sig  . benzonatate (TESSALON) 100 MG capsule Take by mouth.  . diphenhydrAMINE (BENADRYL) 25 MG tablet Take 25 mg by mouth as needed for allergies.  . finasteride (PROSCAR) 5 MG tablet Take 5 mg by mouth daily.  . finasteride (PROSCAR) 5 MG tablet TAKE 1 TABLET BY MOUTH EVERY DAY  . Ibuprofen (ADVIL) 200 MG CAPS Take 1 capsule by mouth as needed.  . tadalafil (CIALIS) 5 MG tablet Take by mouth.  . zolpidem (AMBIEN) 5 MG tablet    No facility-administered encounter medications on file as of 07/16/2018.     Allergies as of 07/16/2018  . (No Known Allergies)    Past Medical History:  Diagnosis Date  . Allergy   . Enlarged prostate   . Hematuria   . Sleep disturbance   . Urinary urgency     Past Surgical History:  Procedure Laterality Date  . APPENDECTOMY    . DENTAL SURGERY    . FRACTURE SURGERY    . HERNIA REPAIR    . SHOULDER SURGERY Left 2010  . VASECTOMY      Family History  Problem Relation Age of Onset  . Breast cancer Mother   . Heart disease Mother   . Hearing loss Mother   . Migraines Mother   . Alzheimer's disease Mother   . Stroke Mother   .  Heart attack Father     Social History   Socioeconomic History  . Marital status: Divorced    Spouse name: Not on file  . Number of children: 2  . Years of education: Not on file  . Highest education level: Not on file  Occupational History  . Occupation: Insurance underwriter  Social Needs  . Financial resource strain: Not on file  . Food insecurity:    Worry: Not on file    Inability: Not on file  . Transportation needs:    Medical: Not on file    Non-medical: Not on file  Tobacco Use  . Smoking status: Former Smoker    Packs/day: 1.00    Years: 35.00    Pack years: 35.00    Types: Cigarettes    Last attempt to quit: 08/15/2013    Years since quitting: 4.9  . Smokeless tobacco: Never Used  Substance and Sexual Activity  . Alcohol use: Yes    Alcohol/week: 0.0 standard drinks    Comment: socially  . Drug use: No  . Sexual activity: Not on file  Lifestyle  . Physical activity:    Days per week: Not on file    Minutes per session: Not  on file  . Stress: Not on file  Relationships  . Social connections:    Talks on phone: Not on file    Gets together: Not on file    Attends religious service: Not on file    Active member of club or organization: Not on file    Attends meetings of clubs or organizations: Not on file    Relationship status: Not on file  . Intimate partner violence:    Fear of current or ex partner: Not on file    Emotionally abused: Not on file    Physically abused: Not on file    Forced sexual activity: Not on file  Other Topics Concern  . Not on file  Social History Narrative  . Not on file    Review of Systems  Constitutional: Negative.   HENT: Negative.   Eyes: Negative.   Respiratory: Positive for shortness of breath.   Cardiovascular: Negative.   Gastrointestinal: Negative.     Vitals:   07/16/18 1405  BP: 130/74  Pulse: (!) 56  SpO2: 97%     Physical Exam  Constitutional: He appears well-developed and  well-nourished.  HENT:  Head: Normocephalic and atraumatic.  Eyes: Pupils are equal, round, and reactive to light. Conjunctivae and EOM are normal. Right eye exhibits no discharge. Left eye exhibits no discharge.  Neck: Normal range of motion. Neck supple. No tracheal deviation present. No thyromegaly present.  Cardiovascular: Normal rate and regular rhythm.  Pulmonary/Chest: Effort normal and breath sounds normal. No respiratory distress. He has no wheezes. He has no rales.  Abdominal: Soft. Bowel sounds are normal. He exhibits no distension. There is no abdominal tenderness.   Data Reviewed: Previous CT scan of the chest reviewed  Assessment:   Shortness of breath  May have underlying obstructive lung disease -History of smoking, history of exposure to printing dust/fumes  Plan/Recommendations:  Obtain a full full pulmonary function test  Obtain a CT scan of the chest-CT been performed secondary to an abnormal nodule noted on the previous /significant smoking history  I will see him back in the office in about 3 months Encouraged to call with any significant concerns  Virl Diamond MD [facility] 07/16/2018, 2:22 PM  CC: Roger Kill, *

## 2018-07-16 NOTE — Patient Instructions (Signed)
Shortness of breath Previous smoker Abnormal CT scan of the chest from 2010 showing a peripheral nodule  We will obtain a pulmonary function study We will obtain a CT scan of the chest without contrast  Tentatively see you back in the office in about 3 months, we will update you as results of above become available

## 2018-07-19 ENCOUNTER — Ambulatory Visit (INDEPENDENT_AMBULATORY_CARE_PROVIDER_SITE_OTHER)
Admission: RE | Admit: 2018-07-19 | Discharge: 2018-07-19 | Disposition: A | Discharge status: Home / Self Care | Payer: BLUE CROSS/BLUE SHIELD | Source: Ambulatory Visit | Attending: Pulmonary Disease | Admitting: Pulmonary Disease

## 2018-07-19 DIAGNOSIS — R0602 Shortness of breath: Secondary | ICD-10-CM

## 2018-07-26 ENCOUNTER — Telehealth: Payer: Self-pay | Admitting: Pulmonary Disease

## 2018-07-26 DIAGNOSIS — R0602 Shortness of breath: Secondary | ICD-10-CM

## 2018-07-26 NOTE — Telephone Encounter (Signed)
Spoke with pt. He is aware of results. Order has been placed for the repeat CT in 1 year. Nothing further was needed.

## 2018-07-26 NOTE — Telephone Encounter (Signed)
ATC pt, no answer. Left message for pt to call back. I left a detailed message advising him that AO would be here next week and that we would call him as soon as he reviews CT.   AO please review CT results

## 2018-07-26 NOTE — Telephone Encounter (Signed)
Pt is calling back [phone]

## 2018-07-26 NOTE — Telephone Encounter (Signed)
Stable from previous  One small nodule was not noted on previous CT  Will need repeat CT in 1 year  No other changes

## 2018-07-26 NOTE — Telephone Encounter (Signed)
lmom  X 2

## 2018-10-23 ENCOUNTER — Ambulatory Visit: Payer: BLUE CROSS/BLUE SHIELD | Admitting: Pulmonary Disease

## 2019-07-14 ENCOUNTER — Other Ambulatory Visit: Payer: Self-pay | Admitting: Pulmonary Disease

## 2019-07-14 DIAGNOSIS — R0602 Shortness of breath: Secondary | ICD-10-CM

## 2019-07-17 ENCOUNTER — Telehealth: Payer: Self-pay | Admitting: Pulmonary Disease

## 2019-07-17 NOTE — Telephone Encounter (Signed)
Dylan Jones (MRN [id number]) for a year follow up CT Chest w/o for Jan 18th at 4:30 for Dr. Wynona Neat. He would like to speak to the RN or Dr. before he has it. He is not sure he wants it or even needs it. I told him about it being a year follow up lung nodule but he still wants to talk with them first.  Can you give them the message to call him?

## 2019-07-17 NOTE — Telephone Encounter (Signed)
lmtcb for pt.  

## 2019-07-17 NOTE — Telephone Encounter (Signed)
Spoke with the pt   He is questioning if he really needs to have the chest CT that he is scheduled for 07/21/19  He states that his nodule is not something new  He wants his chart to be reviewed by Dr Val Eagle to be sure  Please advise thanks

## 2019-07-21 ENCOUNTER — Inpatient Hospital Stay: Admission: RE | Admit: 2019-07-21 | Payer: BLUE CROSS/BLUE SHIELD | Source: Ambulatory Visit

## 2019-07-21 NOTE — Telephone Encounter (Signed)
Pt is already scheduled for the CT on 1/18 @ 4:30.Marland KitchenMarland Kitchen

## 2019-07-21 NOTE — Telephone Encounter (Signed)
Called pt & gave him phone # to [facility].  He is going to call them to reschedule.  Nothing further needed.

## 2019-07-21 NOTE — Telephone Encounter (Signed)
Spoke with patient  Agrees to have CT Kindly schedule his CT chest

## 2019-07-21 NOTE — Telephone Encounter (Signed)
Please schedule patient for Ct per Dr. Val Eagle

## 2019-07-21 NOTE — Telephone Encounter (Signed)
Called and spoke with pt in regards to his scheduled CT today at 4:30. While speaking with pt, pt had stated that he is needing to reschedule the CT.

## 2019-08-04 ENCOUNTER — Other Ambulatory Visit: Payer: Self-pay

## 2019-08-04 ENCOUNTER — Ambulatory Visit (INDEPENDENT_AMBULATORY_CARE_PROVIDER_SITE_OTHER)
Admission: RE | Admit: 2019-08-04 | Discharge: 2019-08-04 | Disposition: A | Discharge status: Home / Self Care | Payer: BLUE CROSS/BLUE SHIELD | Source: Ambulatory Visit | Attending: Pulmonary Disease | Admitting: Pulmonary Disease

## 2019-08-04 DIAGNOSIS — R0602 Shortness of breath: Secondary | ICD-10-CM

## 2019-08-12 ENCOUNTER — Telehealth: Payer: Self-pay | Admitting: Pulmonary Disease

## 2019-08-12 NOTE — Telephone Encounter (Signed)
Did review the CT  Stable 5 mm right upper lobe nodule.  The new 4 mm nodule on the recent study is resolved  No follow-up CT scan of the chest needed

## 2019-08-12 NOTE — Telephone Encounter (Signed)
ATC pt, no answer. Left message for pt to call back.  

## 2019-08-12 NOTE — Telephone Encounter (Signed)
Advised pt of results. Pt understood and nothing further is needed.   

## 2019-08-12 NOTE — Telephone Encounter (Signed)
Pt had Ct 2/1 and is calling requesting to know the results. Dr. Wynona Neat, please advise on this for pt. Thanks!

## 2021-05-09 ENCOUNTER — Emergency Department (HOSPITAL_COMMUNITY): Payer: BLUE CROSS/BLUE SHIELD

## 2021-05-09 ENCOUNTER — Emergency Department (HOSPITAL_COMMUNITY)
Admission: EM | Admit: 2021-05-09 | Discharge: 2021-05-09 | Disposition: A | Discharge status: Home / Self Care | Payer: BLUE CROSS/BLUE SHIELD | Attending: Student | Admitting: Student

## 2021-05-09 ENCOUNTER — Encounter (HOSPITAL_COMMUNITY): Payer: Self-pay

## 2021-05-09 ENCOUNTER — Other Ambulatory Visit: Payer: Self-pay

## 2021-05-09 DIAGNOSIS — R079 Chest pain, unspecified: Secondary | ICD-10-CM

## 2021-05-09 DIAGNOSIS — M549 Dorsalgia, unspecified: Secondary | ICD-10-CM | POA: Diagnosis not present

## 2021-05-09 DIAGNOSIS — R42 Dizziness and giddiness: Secondary | ICD-10-CM | POA: Insufficient documentation

## 2021-05-09 DIAGNOSIS — R0789 Other chest pain: Secondary | ICD-10-CM | POA: Diagnosis not present

## 2021-05-09 DIAGNOSIS — Z87891 Personal history of nicotine dependence: Secondary | ICD-10-CM | POA: Diagnosis not present

## 2021-05-09 LAB — CBC
HCT: 41.2 % (ref 39.0–52.0)
Hemoglobin: 13.7 g/dL (ref 13.0–17.0)
MCH: 29.7 pg (ref 26.0–34.0)
MCHC: 33.3 g/dL (ref 30.0–36.0)
MCV: 89.2 fL (ref 80.0–100.0)
Platelets: 226 10*3/uL (ref 150–400)
RBC: 4.62 MIL/uL (ref 4.22–5.81)
RDW: 13.6 % (ref 11.5–15.5)
WBC: 6.5 10*3/uL (ref 4.0–10.5)
nRBC: 0 % (ref 0.0–0.2)

## 2021-05-09 LAB — BASIC METABOLIC PANEL
Anion gap: 8 (ref 5–15)
BUN: 13 mg/dL (ref 6–20)
CO2: 23 mmol/L (ref 22–32)
Calcium: 9.1 mg/dL (ref 8.9–10.3)
Chloride: 106 mmol/L (ref 98–111)
Creatinine, Ser: 1.06 mg/dL (ref 0.61–1.24)
GFR, Estimated: >60 mL/min (ref >60)
Glucose, Bld: 205 mg/dL — ABNORMAL HIGH (ref 70–99)
Potassium: 3.5 mmol/L (ref 3.5–5.1)
Sodium: 137 mmol/L (ref 135–145)

## 2021-05-09 LAB — TROPONIN I (HIGH SENSITIVITY)
Troponin I (High Sensitivity): 3 ng/L (ref <18)
Troponin I (High Sensitivity): 4 ng/L (ref <18)

## 2021-05-09 LAB — URINALYSIS, ROUTINE W REFLEX MICROSCOPIC
Bilirubin Urine: NEGATIVE
Glucose, UA: 50 mg/dL — AB
Hgb urine dipstick: NEGATIVE
Ketones, ur: NEGATIVE mg/dL
Leukocytes,Ua: NEGATIVE
Nitrite: NEGATIVE
Protein, ur: NEGATIVE mg/dL
Specific Gravity, Urine: 1.036 — ABNORMAL HIGH (ref 1.005–1.030)
pH: 6 (ref 5.0–8.0)

## 2021-05-09 LAB — CBG MONITORING, ED: Glucose-Capillary: 114 mg/dL — ABNORMAL HIGH (ref 70–99)

## 2021-05-09 MED ORDER — ACETAMINOPHEN 500 MG PO TABS
1000.0000 mg | ORAL_TABLET | Freq: Once | ORAL | Status: AC
  Administered 2021-05-09: 1000 mg via ORAL
  Filled 2021-05-09: qty 2

## 2021-05-09 MED ORDER — IOHEXOL 350 MG/ML SOLN
100.0000 mL | Freq: Once | INTRAVENOUS | Status: AC | PRN
  Administered 2021-05-09: 100 mL via INTRAVENOUS

## 2021-05-09 MED ORDER — SODIUM CHLORIDE 0.9 % IV BOLUS
1000.0000 mL | Freq: Once | INTRAVENOUS | Status: AC
  Administered 2021-05-09: 1000 mL via INTRAVENOUS

## 2021-05-09 NOTE — ED Triage Notes (Signed)
Patient arrived by [facility] following episode of back pain with radiation to chest. Fire found BP in 50s. After sitting for a while the pain and dizziness resolved and on arrival to ED no complaints. Patient reports that he has been having intermittent dizziness for the past several weeks and reports 2-3 episodes weekly of this. Patient alert and oriented, denies all pain and discomfort

## 2021-05-09 NOTE — ED Notes (Signed)
Provided pt with food and drink per [facility]

## 2021-05-09 NOTE — Discharge Instructions (Addendum)
Dear Rosanne Ashing,  Thank you for allowing [place] to take care of you today.  We hope you begin feeling better soon.  - Please follow-up with your primary care physician or schedule an appointment to establish a primary care doctor if you do not have one already. - Please return to the Emergency Department or call 911 for chest pain, shortness of breath, severe pain, altered mental status, or if you have any reason to think you may need emergency medical care. -Call your PCP and cardiologist for close follow-up -May continue normal home medications as previously directed -Remember to stay well-hydrated and change positions slowly   Sincerely,  Dwaine Gale, DO Department of Emergency Medicine [place]   Light headedness  Chest pain, unspecified type

## 2021-05-09 NOTE — ED Provider Notes (Signed)
  Physical Exam  BP 117/75   Pulse 67   Temp 97.8 F (36.6 C) (Oral)   Resp 19   SpO2 99%   Physical Exam  ED Course/Procedures     Procedures  MDM  Care of patient assumed from previous provider at shift change. Please refer to their note for further history and plan.  In brief, patient is a 55 y.o. y/o male presenting with: - CP - PMHx: Past Medical History:  Diagnosis Date   Allergy    Enlarged prostate    Hematuria    Sleep disturbance    Urinary urgency     Review of ED course: - Hypotensive per EMS - Neg dissection study   Plan at the time of handoff is as follows: - f/u pending delta troponin - likely discharge if stable   Additional MDM:  VS upon my assumption of care were wnl. No further workup during my care.  Labs: Initial troponin of 3, repeat of 4.   Medications: Medications  iohexol (OMNIPAQUE) 350 MG/ML injection 100 mL (100 mLs Intravenous Contrast Given 05/09/21 1210)  sodium chloride 0.9 % bolus 1,000 mL (0 mLs Intravenous Stopped 05/09/21 1351)  acetaminophen (TYLENOL) tablet 1,000 mg (1,000 mg Oral Given 05/09/21 1351)   Patient re-evaluated after resuscitation. Hemodynamically stable and in no acute distress.  At neurologic baseline in all extremities.  Patient states he has a history of chronic dizziness episodes and this was similar to prior, just slightly worse.  Chest pain has resolved.  Shared decision-making held with patient and significant other, agree for discharge with close outpatient follow-up and rapid ED return for return of chest pain, Kabbe, or other new or worsening symptoms.  Well-appearing on my repeat exam.  Tolerating p.o. well.  Ambulates from the ED without difficulty.  Discharged home in stable condition. Strict ED return precautions advised and discussed at length. Supportive care discussed, advised occasional OTC meclizine. Advised close PCP and prior cardiologist follow up. Patient understands and agrees with the  plan.  The plan for this patient was discussed with my attending physician, who voiced agreement and who oversaw evaluation and treatment of this patient.     Note: Chief Executive Officer was used in the creation of this note.   1. Light headedness   2. Chest pain, unspecified type         Dwaine Gale, DO 05/09/21 1909    Cathren Laine, MD 05/09/21 [phone]

## 2021-05-09 NOTE — ED Provider Notes (Signed)
[facility] Note   CSN: [id number] Arrival date & time: 05/09/21  1049     History No chief complaint on file.   Dylan Jones is a 55 y.o. male with a past medical history of anxiety and hypertension presenting today with a complaint of dizziness, chest and back pain.  Patient reports that this morning he became dizzy and sat down and while he was sitting down he had chest pain that shot through to his back.  Reports that he has been getting dizzy and short of breath on and off for "a long time."  Says that it usually goes away when he rests however this time it was not going away.  Denies any drug use.  Previous smoker, quit 2 years ago.  No hyperlipidemia or cardiac history.   Past Medical History:  Diagnosis Date   Allergy    Enlarged prostate    Hematuria    Sleep disturbance    Urinary urgency     Patient Active Problem List   Diagnosis Date Noted   Generalized anxiety disorder 12/14/2013   Insomnia, unspecified 12/14/2013   Tinea cruris 12/14/2013   Nicotine addiction---QUIT 08/2014!!!!!!!!!!! 12/14/2013    Past Surgical History:  Procedure Laterality Date   APPENDECTOMY     DENTAL SURGERY     FRACTURE SURGERY     HERNIA REPAIR     SHOULDER SURGERY Left 2010   VASECTOMY         Family History  Problem Relation Age of Onset   Breast cancer Mother    Heart disease Mother    Hearing loss Mother    Migraines Mother    Alzheimer's disease Mother    Stroke Mother    Heart attack Father     Social History   Tobacco Use   Smoking status: Former    Packs/day: 1.00    Years: 35.00    Pack years: 35.00    Types: Cigarettes    Quit date: 08/15/2013    Years since quitting: 7.7   Smokeless tobacco: Never  Vaping Use   Vaping Use: Never used  Substance Use Topics   Alcohol use: Yes    Alcohol/week: 0.0 standard drinks    Comment: socially   Drug use: No    Home Medications Prior to Admission  medications   Medication Sig Start Date End Date Taking? Authorizing Provider  diphenhydrAMINE (BENADRYL) 25 MG tablet Take 25 mg by mouth as needed for allergies.    [provider]  finasteride (PROSCAR) 5 MG tablet Take 5 mg by mouth daily.    [provider]  finasteride (PROSCAR) 5 MG tablet TAKE 1 TABLET BY MOUTH EVERY DAY 07/08/18   [provider]  Ibuprofen (ADVIL) 200 MG CAPS Take 1 capsule by mouth as needed.    [provider]  tadalafil (CIALIS) 5 MG tablet Take by mouth. 07/01/18 07/31/18  [provider]  zolpidem (AMBIEN) 5 MG tablet  05/15/18   [provider]    Allergies    Patient has no known allergies.  Review of Systems   Review of Systems  Constitutional:  Positive for fatigue.  Respiratory:  Positive for shortness of breath.   Cardiovascular:  Positive for chest pain.  Musculoskeletal:  Positive for back pain.  Neurological:  Positive for dizziness and weakness. Negative for syncope and headaches.  All other systems reviewed and are negative.  Physical Exam Updated Vital Signs BP (!) 85/58 (BP  Location: Left Arm)   Pulse 64   Temp 98.2 F (36.8 C) (Oral)   Resp 19   SpO2 98%   Physical Exam Vitals and nursing note reviewed.  Constitutional:      General: He is not in acute distress.    Appearance: Normal appearance. He is not ill-appearing.  HENT:     Head: Normocephalic and atraumatic.  Eyes:     General: No scleral icterus.    Conjunctiva/sclera: Conjunctivae normal.  Cardiovascular:     Rate and Rhythm: Normal rate and regular rhythm.     Heart sounds: No murmur heard. Pulmonary:     Effort: Pulmonary effort is normal. No respiratory distress.     Breath sounds: No wheezing or rales.  Skin:    Findings: No rash.  Neurological:     Mental Status: He is alert.  Psychiatric:        Mood and Affect: Mood normal.    ED Results / Procedures / Treatments   Labs (all labs ordered are  listed, but only abnormal results are displayed) Labs Reviewed  BASIC METABOLIC PANEL - Abnormal; Notable for the following components:      Result Value   Glucose, Bld 205 (*)    All other components within normal limits  URINALYSIS, ROUTINE W REFLEX MICROSCOPIC - Abnormal; Notable for the following components:   Specific Gravity, Urine 1.036 (*)    Glucose, UA 50 (*)    All other components within normal limits  CBG MONITORING, ED - Abnormal; Notable for the following components:   Glucose-Capillary 114 (*)    All other components within normal limits  CBC  TROPONIN I (HIGH SENSITIVITY)  TROPONIN I (HIGH SENSITIVITY)    EKG None  Radiology CT Angio Chest/Abd/Pel for Dissection W and/or Wo Contrast  Result Date: 05/09/2021 CLINICAL DATA:  Chest and back pain.  Aortic dissection suspected. EXAM: CT ANGIOGRAPHY CHEST, ABDOMEN AND PELVIS TECHNIQUE: Non-contrast CT of the chest was initially obtained. Multidetector CT imaging through the chest, abdomen and pelvis was performed using the standard protocol during bolus administration of intravenous contrast. Multiplanar reconstructed images and MIPs were obtained and reviewed to evaluate the vascular anatomy. CONTRAST:  OMNIPAQUE IOHEXOL 350 MG/ML SOLN COMPARISON:  Prior CT chest 08/04/2019; CT abdomen 11/06/2008 FINDINGS: CTA CHEST FINDINGS Cardiovascular: Preferential opacification of the thoracic aorta. No evidence of thoracic aortic aneurysm or dissection. Normal heart size. No pericardial effusion. Mediastinum/Nodes: Unremarkable CT appearance of the thyroid gland. No suspicious mediastinal or hilar adenopathy. No soft tissue mediastinal mass. The thoracic esophagus is unremarkable. Lungs/Pleura: Stable right apical pleuroparenchymal scarring. No focal airspace infiltrate, pulmonary edema, pleural effusion or pneumothorax. Small calcified right upper lobe pulmonary nodule remains stable. Additional scattered small subpleural pulmonary  nodules are also unchanged dating back to February of 2021 consistent with a benign process. Musculoskeletal: Remote healed left-sided rib fractures. No acute fracture or lytic or blastic osseous lesion. Review of the MIP images confirms the above findings. CTA ABDOMEN AND PELVIS FINDINGS VASCULAR Aorta: Normal caliber aorta without aneurysm, dissection, vasculitis or significant stenosis. Celiac: Patent without evidence of aneurysm, dissection, vasculitis or significant stenosis. SMA: Patent without evidence of aneurysm, dissection, vasculitis or significant stenosis. Renals: Solitary right renal artery. Co-dominant dual left-sided renal arteries. IMA: Patent without evidence of aneurysm, dissection, vasculitis or significant stenosis. Inflow: Patent without evidence of aneurysm, dissection, vasculitis or significant stenosis. Veins: No focal venous abnormality. Review of the MIP images confirms the above findings. NON-VASCULAR Hepatobiliary: No focal  liver abnormality is seen. No gallstones, gallbladder wall thickening, or biliary dilatation. Pancreas: Unremarkable. No pancreatic ductal dilatation or surrounding inflammatory changes. Spleen: Normal in size without focal abnormality. Adrenals/Urinary Tract: Normal adrenal glands. No evidence of hydronephrosis or enhancing renal mass. Small 4 mm nonobstructing stone in the left lower pole cortex. Unremarkable bladder. Stomach/Bowel: No focal bowel wall thickening or evidence of obstruction. The appendix is not visualized and is likely surgically absent. Lymphatic: No suspicious lymphadenopathy. Reproductive: Prostate is unremarkable. Other: No abdominal wall hernia or abnormality. No abdominopelvic ascites. Musculoskeletal: No acute or significant osseous findings. Review of the MIP images confirms the above findings. IMPRESSION: 1. Negative for evidence of aortic dissection, intramural hematoma or other acute vascular abnormality. 2. No acute abnormality identified  within the chest, abdomen or pelvis. 3. Stable and almost certainly benign small pulmonary nodules. No further follow-up required. 4. Nonobstructing 3 mm left lower pole nephrolithiasis. 5. Colonic diverticular disease without CT evidence of active inflammation. Electronically Signed   By: Jacqulynn Cadet M.D.   On: 05/09/2021 12:28    Procedures Procedures   Medications Ordered in ED Medications - No data to display  ED Course  I have reviewed the triage vital signs and the nursing notes.  Pertinent labs & imaging results that were available during my care of the patient were reviewed by me and considered in my medical decision making (see chart for details).    MDM Rules/Calculators/A&P The emergent differential diagnosis of chest pain includes but is not limited to: Acute coronary syndrome, pericarditis, aortic dissection, pulmonary embolism, tension pneumothorax, and esophageal rupture.  All of these were considered throughout the evaluation of this patient. Patient was initially evaluated by me.  He was in no acute distress however did have a blood pressure reading in the 80s/50s.  He reports that usually his blood pressure is slightly high, however he was started on a medication for this.  Still endorsing dizziness and chest/back pain.  Blood pressure in right arm within normal limits, left arm blood pressure in 80s/50s x2.  CT tech called to initiate dissection study.  EKG normal sinus.  Distal pulses equal bilaterally.    Dissection study negative.  Patient informed of this and reports he currently has no chest or back pain.  States he continues to get dizzy when ambulating.  He takes Cialis and finasteride daily, has not taken either today.  He has been on these medications for 1 year, I doubt this is contributing to his symptoms.  No other new medications, drug or alcohol use.  Patient had an echo in 2019 that was normal.  Has not seen the cardiologist since his echo. At the time of his  negative cardiac work-up, providers believe that his shallow breathing must be pulmonic in nature.  At that time he obtained CT imaging of his chest which noted likely benign nodules and no other abnormalities.  This is consistent with his scan today.  Patient appears stable and not ill at this point in time.  First troponin negative.  Urinalysis without abnormalities.  Second troponin pending at this time.  If troponin is negative patient will likely be discharged with cardiology follow-up. Patient signed out to [facility] at shift change. See her note for continuation of care and ultimate dispo.  Final Clinical Impression(s) / ED Diagnoses Final diagnoses:  Light headedness  Chest pain, unspecified type      Rhae Hammock, PA-C 05/09/21 1542    Kommor, [place], MD 05/10/21 [id number]

## 2022-05-20 IMAGING — CT CT ANGIO CHEST-ABD-PELV FOR DISSECTION W/ AND WO/W CM
2 of 7 series · 12 of 46 positions shown, 14 images · IV contrast (APPLIED)
Comparison: Prior CT chest 08/04/2019; CT abdomen 11/06/2008

CLINICAL DATA: Chest and back pain.  Aortic dissection suspected.

EXAM:
CT ANGIOGRAPHY CHEST, ABDOMEN AND PELVIS
TECHNIQUE: Non-contrast CT of the chest was initially obtained.

[Series 5: arterial · axial · arterial · 0.73mm/px · z∈[+879,+1501]mm · 9 of 351 slices shown, 11 images]
[im 20/351  soft-tissue]
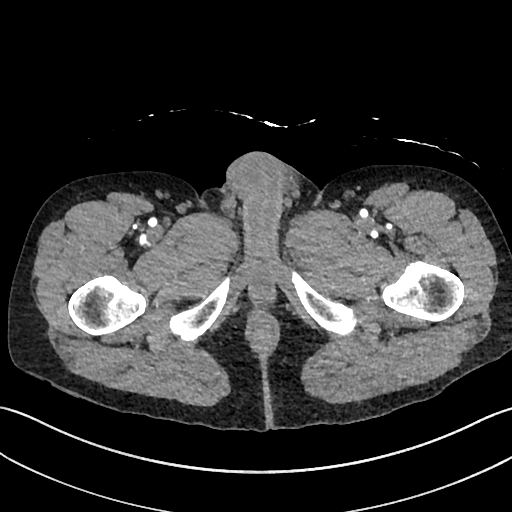
[im 20/351  bone]
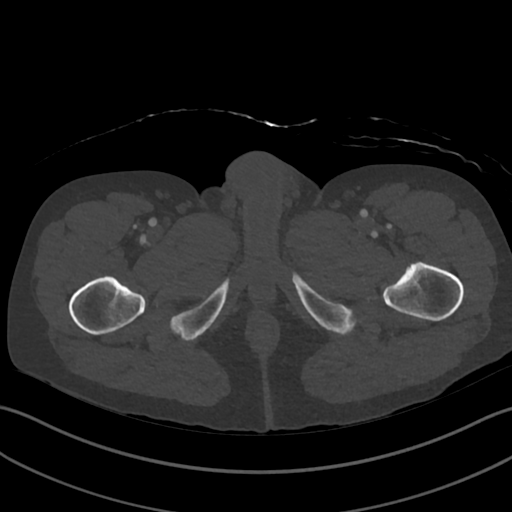
[im 59/351  soft-tissue]
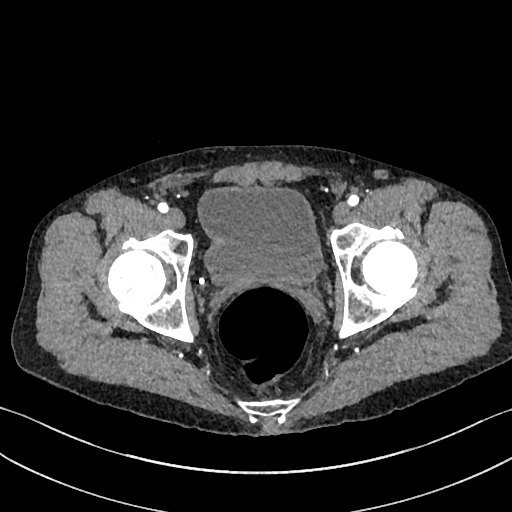
[im 98/351  soft-tissue]
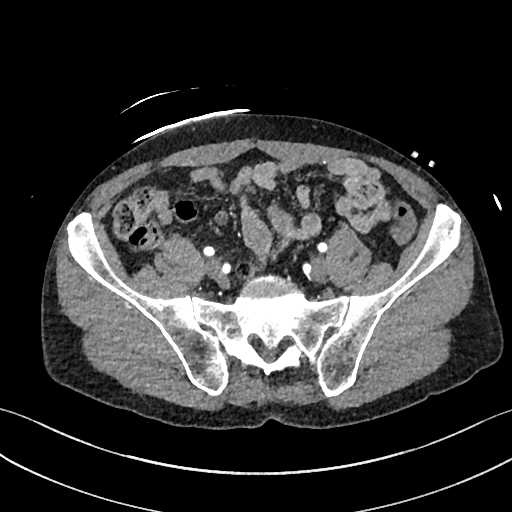
[im 137/351  soft-tissue]
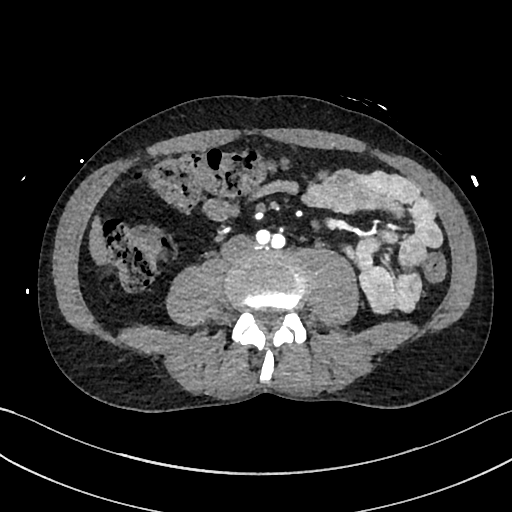
[im 176/351  soft-tissue]
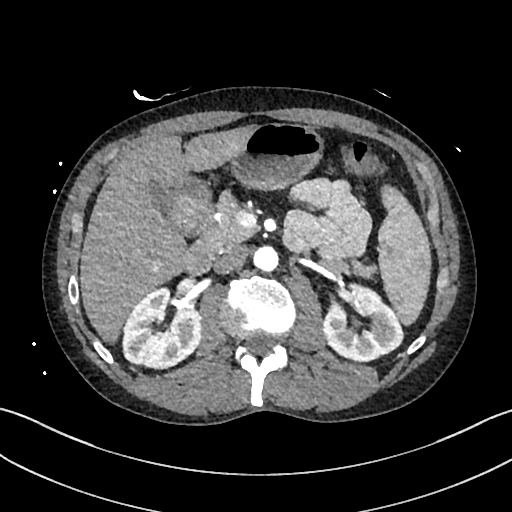
[im 214/351  soft-tissue]
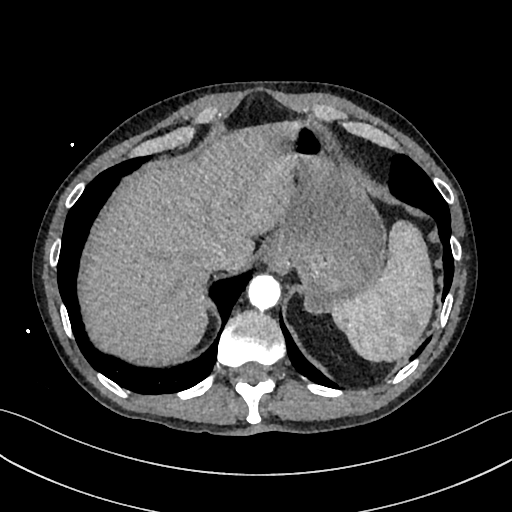
[im 253/351  soft-tissue]
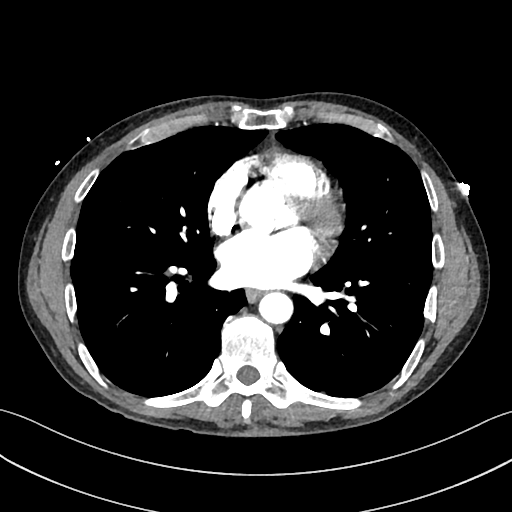
[im 292/351  soft-tissue]
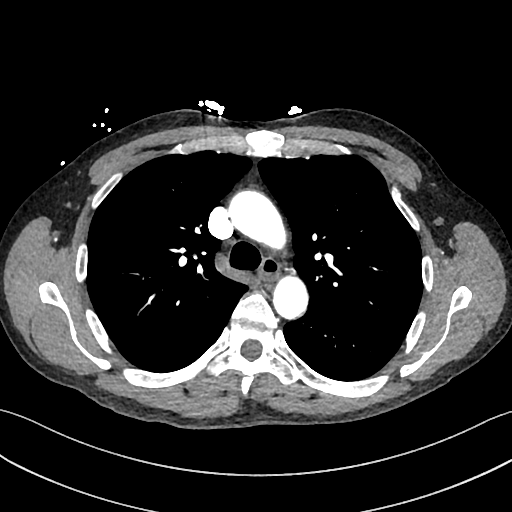
[im 331/351  soft-tissue]
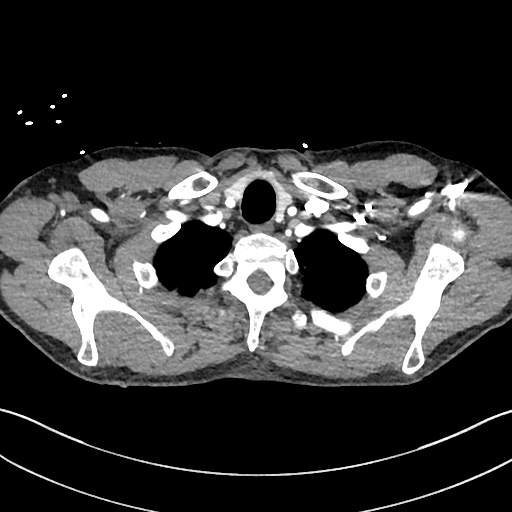
[im 331/351  bone]
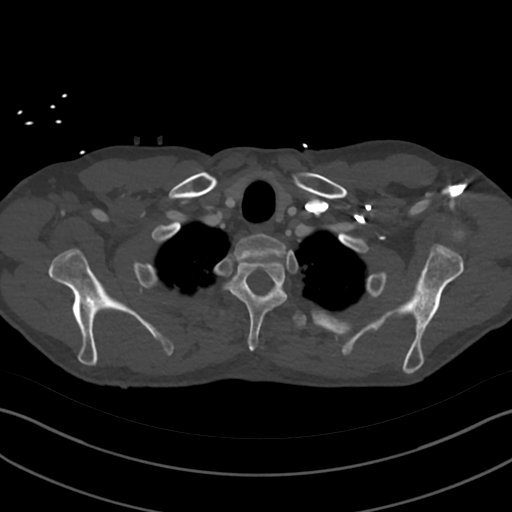

[Series 9: cor · coronal · 0.61mm/px · 3 of 140 slices shown]
[im 35/140  soft-tissue]
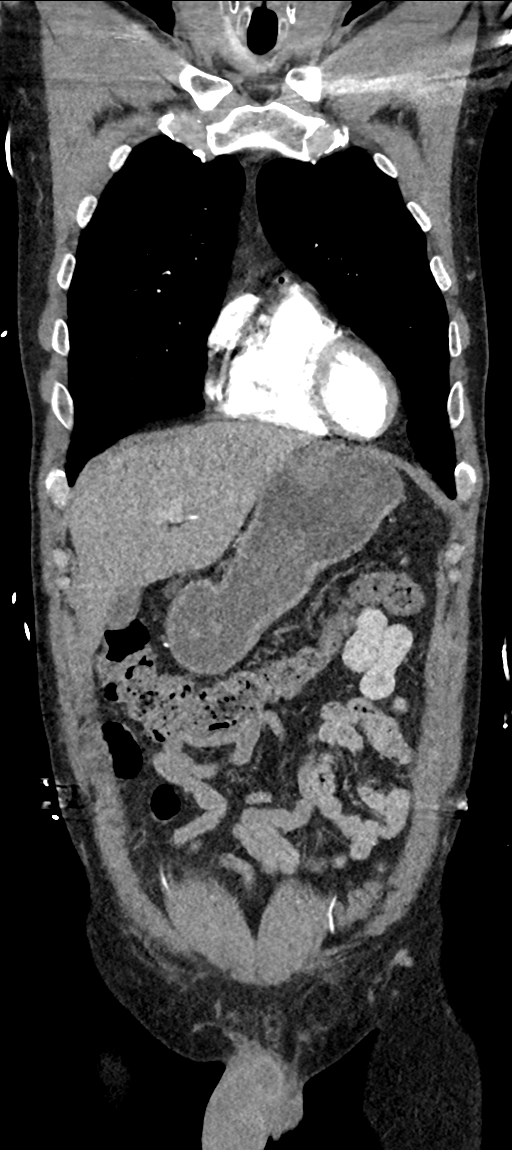
[im 70/140  soft-tissue]
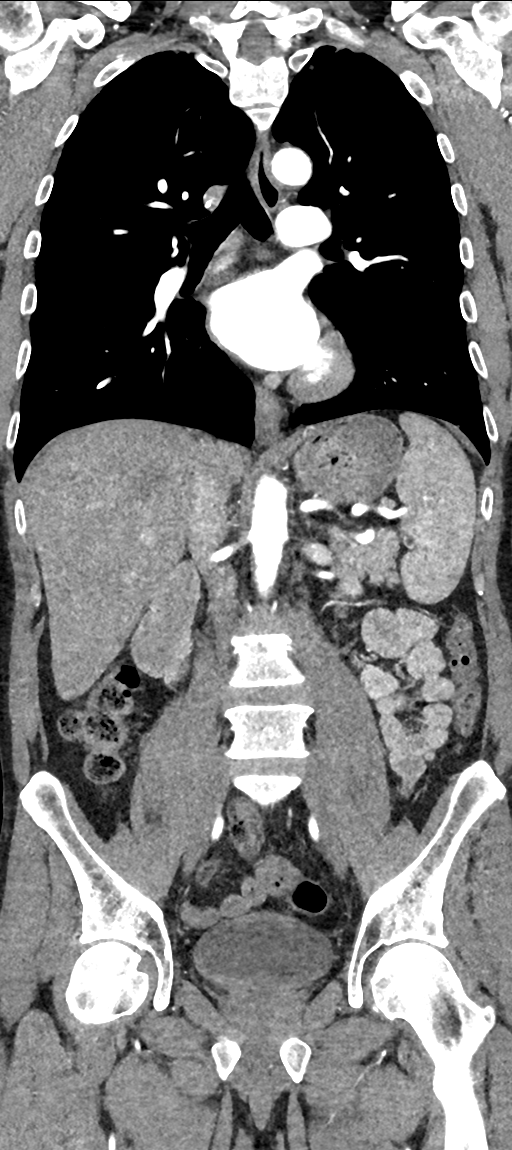
[im 105/140  soft-tissue]
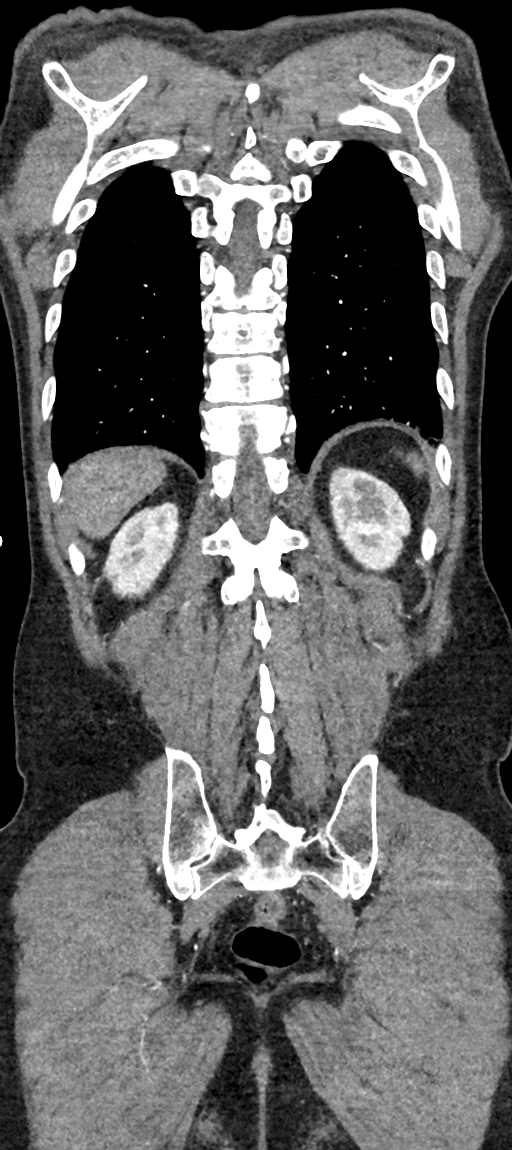

[12 of 46 positions shown; findings below may reference images not displayed]

Multidetector CT imaging through the chest, abdomen and pelvis was
performed using the standard protocol during bolus administration of
intravenous contrast. Multiplanar reconstructed images and MIPs were
obtained and reviewed to evaluate the vascular anatomy.

CONTRAST:  100mL OMNIPAQUE IOHEXOL 350 MG/ML SOLN
FINDINGS: CTA CHEST FINDINGS

Cardiovascular: Preferential opacification of the thoracic aorta. No
evidence of thoracic aortic aneurysm or dissection. Normal heart
size. No pericardial effusion.

Mediastinum/Nodes: Unremarkable CT appearance of the thyroid gland.
No suspicious mediastinal or hilar adenopathy. No soft tissue
mediastinal mass. The thoracic esophagus is unremarkable.

Lungs/Pleura: Stable right apical pleuroparenchymal scarring. No
focal airspace infiltrate, pulmonary edema, pleural effusion or
pneumothorax. Small calcified right upper lobe pulmonary nodule
remains stable. Additional scattered small subpleural pulmonary
nodules are also unchanged dating back to Sunday August, 2019
consistent with a benign process.

Musculoskeletal: Remote healed left-sided rib fractures. No acute
fracture or lytic or blastic osseous lesion.

Review of the MIP images confirms the above findings.

CTA ABDOMEN AND PELVIS FINDINGS

VASCULAR

Aorta: Normal caliber aorta without aneurysm, dissection, vasculitis
or significant stenosis.

Celiac: Patent without evidence of aneurysm, dissection, vasculitis
or significant stenosis.

SMA: Patent without evidence of aneurysm, dissection, vasculitis or
significant stenosis.

Renals: Solitary right renal artery. Co-dominant dual left-sided
renal arteries.

IMA: Patent without evidence of aneurysm, dissection, vasculitis or
significant stenosis.

Inflow: Patent without evidence of aneurysm, dissection, vasculitis
or significant stenosis.

Veins: No focal venous abnormality.

Review of the MIP images confirms the above findings.

NON-VASCULAR

Hepatobiliary: No focal liver abnormality is seen. No gallstones,
gallbladder wall thickening, or biliary dilatation.

Pancreas: Unremarkable. No pancreatic ductal dilatation or
surrounding inflammatory changes.

Spleen: Normal in size without focal abnormality.

Adrenals/Urinary Tract: Normal adrenal glands. No evidence of
hydronephrosis or enhancing renal mass. Small 4 mm nonobstructing
stone in the left lower pole cortex. Unremarkable bladder.

Stomach/Bowel: No focal bowel wall thickening or evidence of
obstruction. The appendix is not visualized and is likely surgically
absent.

Lymphatic: No suspicious lymphadenopathy.

Reproductive: Prostate is unremarkable.

Other: No abdominal wall hernia or abnormality. No abdominopelvic
ascites.

Musculoskeletal: No acute or significant osseous findings.

Review of the MIP images confirms the above findings.
IMPRESSION: 1. Negative for evidence of aortic dissection, intramural hematoma
or other acute vascular abnormality.
2. No acute abnormality identified within the chest, abdomen or
pelvis.
3. Stable and almost certainly benign small pulmonary nodules. No
further follow-up required.
4. Nonobstructing 3 mm left lower pole nephrolithiasis.
5. Colonic diverticular disease without CT evidence of active
inflammation.
